# Patient Record
Sex: Female | Born: 1975 | Race: White | Hispanic: No | Marital: Married | Smoking: Never smoker
Health system: Southern US, Community
[De-identification: ages and names within clinical notes are randomized; demographics above are authoritative.]

## PROBLEM LIST (undated history)

## (undated) DIAGNOSIS — D649 Anemia, unspecified: Secondary | ICD-10-CM

## (undated) DIAGNOSIS — O09299 Supervision of pregnancy with other poor reproductive or obstetric history, unspecified trimester: Secondary | ICD-10-CM

## (undated) DIAGNOSIS — E282 Polycystic ovarian syndrome: Secondary | ICD-10-CM

## (undated) DIAGNOSIS — Z87442 Personal history of urinary calculi: Secondary | ICD-10-CM

## (undated) DIAGNOSIS — O09529 Supervision of elderly multigravida, unspecified trimester: Secondary | ICD-10-CM

## (undated) DIAGNOSIS — L680 Hirsutism: Secondary | ICD-10-CM

## (undated) DIAGNOSIS — N209 Urinary calculus, unspecified: Secondary | ICD-10-CM

## (undated) HISTORY — DX: Hirsutism: L68.0

## (undated) HISTORY — DX: Personal history of urinary calculi: Z87.442

## (undated) HISTORY — DX: Supervision of elderly multigravida, unspecified trimester: O09.529

## (undated) HISTORY — DX: Anemia, unspecified: D64.9

## (undated) HISTORY — DX: Supervision of pregnancy with other poor reproductive or obstetric history, unspecified trimester: O09.299

## (undated) HISTORY — DX: Polycystic ovarian syndrome: E28.2

## (undated) HISTORY — PX: OTHER SURGICAL HISTORY: SHX169

## (undated) HISTORY — DX: Urinary calculus, unspecified: N20.9

---

## 1999-07-27 ENCOUNTER — Other Ambulatory Visit: Admission: RE | Admit: 1999-07-27 | Discharge: 1999-07-27 | Payer: Self-pay | Admitting: Obstetrics and Gynecology

## 2000-08-01 ENCOUNTER — Other Ambulatory Visit: Admission: RE | Admit: 2000-08-01 | Discharge: 2000-08-01 | Payer: Self-pay | Admitting: Obstetrics and Gynecology

## 2001-08-15 ENCOUNTER — Other Ambulatory Visit: Admission: RE | Admit: 2001-08-15 | Discharge: 2001-08-15 | Payer: Self-pay | Admitting: Obstetrics and Gynecology

## 2002-09-18 ENCOUNTER — Other Ambulatory Visit: Admission: RE | Admit: 2002-09-18 | Discharge: 2002-09-18 | Payer: Self-pay | Admitting: Obstetrics and Gynecology

## 2003-11-18 ENCOUNTER — Other Ambulatory Visit: Admission: RE | Admit: 2003-11-18 | Discharge: 2003-11-18 | Payer: Self-pay | Admitting: Obstetrics and Gynecology

## 2005-01-05 ENCOUNTER — Other Ambulatory Visit: Admission: RE | Admit: 2005-01-05 | Discharge: 2005-01-05 | Payer: Self-pay | Admitting: Obstetrics and Gynecology

## 2006-02-18 ENCOUNTER — Other Ambulatory Visit: Admission: RE | Admit: 2006-02-18 | Discharge: 2006-02-18 | Payer: Self-pay | Admitting: Obstetrics & Gynecology

## 2007-02-19 ENCOUNTER — Other Ambulatory Visit: Admission: RE | Admit: 2007-02-19 | Discharge: 2007-02-19 | Payer: Self-pay | Admitting: Obstetrics & Gynecology

## 2007-07-01 ENCOUNTER — Ambulatory Visit (HOSPITAL_COMMUNITY): Admission: RE | Admit: 2007-07-01 | Discharge: 2007-07-01 | Payer: Self-pay | Admitting: Gynecology

## 2008-02-13 ENCOUNTER — Ambulatory Visit: Payer: Self-pay | Admitting: Cardiology

## 2008-02-13 LAB — CONVERTED CEMR LAB: Magnesium: 2.3 mg/dL (ref 1.5–2.5)

## 2008-02-25 ENCOUNTER — Ambulatory Visit: Payer: Self-pay

## 2008-02-25 ENCOUNTER — Encounter: Payer: Self-pay | Admitting: Cardiology

## 2008-02-27 ENCOUNTER — Ambulatory Visit: Payer: Self-pay | Admitting: Cardiology

## 2008-02-27 LAB — CONVERTED CEMR LAB
BUN: 5 mg/dL — ABNORMAL LOW (ref 6–23)
Chloride: 105 meq/L (ref 96–112)
GFR calc non Af Amer: 197 mL/min
Potassium: 3.8 meq/L (ref 3.5–5.1)

## 2008-03-17 ENCOUNTER — Inpatient Hospital Stay (HOSPITAL_COMMUNITY): Admission: AD | Admit: 2008-03-17 | Discharge: 2008-03-17 | Payer: Self-pay | Admitting: Obstetrics and Gynecology

## 2008-03-19 ENCOUNTER — Inpatient Hospital Stay (HOSPITAL_COMMUNITY): Admission: AD | Admit: 2008-03-19 | Discharge: 2008-03-19 | Payer: Self-pay | Admitting: Obstetrics and Gynecology

## 2008-03-20 ENCOUNTER — Inpatient Hospital Stay (HOSPITAL_COMMUNITY): Admission: AD | Admit: 2008-03-20 | Discharge: 2008-03-20 | Payer: Self-pay | Admitting: Obstetrics and Gynecology

## 2008-04-20 ENCOUNTER — Inpatient Hospital Stay (HOSPITAL_COMMUNITY): Admission: AD | Admit: 2008-04-20 | Discharge: 2008-04-20 | Payer: Self-pay | Admitting: Obstetrics and Gynecology

## 2008-04-21 ENCOUNTER — Inpatient Hospital Stay (HOSPITAL_COMMUNITY): Admission: AD | Admit: 2008-04-21 | Discharge: 2008-04-21 | Payer: Self-pay | Admitting: Obstetrics and Gynecology

## 2008-05-02 ENCOUNTER — Inpatient Hospital Stay (HOSPITAL_COMMUNITY): Admission: AD | Admit: 2008-05-02 | Discharge: 2008-05-05 | Payer: Self-pay | Admitting: Obstetrics and Gynecology

## 2008-05-06 ENCOUNTER — Encounter: Admission: RE | Admit: 2008-05-06 | Discharge: 2008-06-05 | Payer: Self-pay | Admitting: Obstetrics and Gynecology

## 2008-06-06 ENCOUNTER — Encounter: Admission: RE | Admit: 2008-06-06 | Discharge: 2008-06-23 | Payer: Self-pay | Admitting: Obstetrics and Gynecology

## 2010-04-09 NOTE — L&D Delivery Note (Signed)
Delivery Note At 5:20 AM a viable and healthy female was delivered via Vaginal, Spontaneous Delivery (Presentation:OA ).  APGAR: 9, 9; weight 8lbs, 2oz .   Placenta status: Intact, Spontaneous.  Cord: 3 vessels with the following complications: Velamentous.  Cord pH: na Thick meconium noted. Peds in attendance.  Anesthesia: Epidural  Episiotomy: none Lacerations: 2nd degree with no extension Suture Repair: 2.0 vicryl rapide Est. Blood Loss (mL): 300  Mom to postpartum.  Baby to nursery-stable.  Alonzo Owczarzak J 02/11/2011, 5:40 AM

## 2010-07-12 LAB — ANTIBODY SCREEN: Antibody Screen: NEGATIVE

## 2010-07-12 LAB — ABO/RH: RH Type: POSITIVE

## 2010-07-12 LAB — RPR: RPR: NONREACTIVE

## 2010-07-24 LAB — CBC
HCT: 33.6 % — ABNORMAL LOW (ref 36.0–46.0)
HCT: 41.8 % (ref 36.0–46.0)
Hemoglobin: 11.4 g/dL — ABNORMAL LOW (ref 12.0–15.0)
Hemoglobin: 14.1 g/dL (ref 12.0–15.0)
MCHC: 34 g/dL (ref 30.0–36.0)
MCV: 95.8 fL (ref 78.0–100.0)
Platelets: 169 10*3/uL (ref 150–400)
Platelets: 181 K/uL (ref 150–400)
RBC: 3.51 MIL/uL — ABNORMAL LOW (ref 3.87–5.11)
RBC: 4.32 MIL/uL (ref 3.87–5.11)
RDW: 12.8 % (ref 11.5–15.5)
RDW: 12.9 % (ref 11.5–15.5)
RDW: 13.1 % (ref 11.5–15.5)
WBC: 11.7 K/uL — ABNORMAL HIGH (ref 4.0–10.5)
WBC: 13.4 10*3/uL — ABNORMAL HIGH (ref 4.0–10.5)

## 2010-07-24 LAB — DIFFERENTIAL
Basophils Absolute: 0 10*3/uL (ref 0.0–0.1)
Basophils Absolute: 0 K/uL (ref 0.0–0.1)
Basophils Relative: 0 % (ref 0–1)
Basophils Relative: 0 % (ref 0–1)
Eosinophils Absolute: 0 10*3/uL (ref 0.0–0.7)
Eosinophils Absolute: 0 K/uL (ref 0.0–0.7)
Eosinophils Relative: 0 % (ref 0–5)
Eosinophils Relative: 0 % (ref 0–5)
Lymphocytes Relative: 4 % — ABNORMAL LOW (ref 12–46)
Lymphs Abs: 0.6 K/uL — ABNORMAL LOW (ref 0.7–4.0)
Monocytes Absolute: 0.8 K/uL (ref 0.1–1.0)
Monocytes Absolute: 0.9 10*3/uL (ref 0.1–1.0)
Monocytes Relative: 5 % (ref 3–12)
Neutro Abs: 13.8 K/uL — ABNORMAL HIGH (ref 1.7–7.7)
Neutrophils Relative %: 91 % — ABNORMAL HIGH (ref 43–77)

## 2010-08-22 NOTE — Discharge Summary (Signed)
NAMESHINE, SCROGHAM           ACCOUNT NO.:  [id number]   MEDICAL RECORD NO.:  [id number]          PATIENT TYPE:  INP   LOCATION:  9310                          FACILITY:  [facility]   PHYSICIAN:  Sherron Monday, MD        DATE OF BIRTH:  1976-02-28   DATE OF ADMISSION:  05/02/2008  DATE OF DISCHARGE:  05/05/2008                               DISCHARGE SUMMARY   ADMITTING DIAGNOSIS:  Intrauterine pregnancy at 32 weeks, preterm labor.   DISCHARGE DIAGNOSES:  Intrauterine pregnancy at 32 weeks, preterm labor,  delivered via spontaneous vaginal delivery.   PROCEDURE:  Spontaneous vaginal delivery.   HISTORY OF PRESENT ILLNESS:  A 35 year old G1, P0 at 32 plus weeks on  bedrest at home with nausea and vomiting overnight as well as diarrhea.  She states that she has had increasing frequency and intensity of  contractions until presentation.  They are now decreased.  She has had  good fetal movement and no loss of fluid, no vaginal bleeding.  She has  in the past had 2 courses of betamethasone.  This is an IVF pregnancy.  She has had heart palpitations which was seen by Dr. Antoine Poche.  She also  has a history of having a positive fetal fibronectin and prior  presentation being approximately 3 cm dilated.  Fetal arrhythmia was  noted on ultrasound in the office and we are in the process of working  it up.  There are occasional fetal heart tones irregularity, now on  continuous monitoring.   PAST MEDICAL HISTORY:  Not significant.   PAST SURGICAL HISTORY:  Not significant.   PAST OB/GYN HISTORY:  G1 is the present pregnancy by IVF with  hyperstimulation.  No abnormal Pap smears or sexually transmitted  diseases.   MEDICATIONS:  Terbutaline q.6, prenatal vitamins, and iron.   ALLERGIES:  No known drug allergies.   SOCIAL HISTORY:  Denies alcohol, tobacco, or drug use.  She is married.   FAMILY HISTORY:  Significant for breast cancer in the mother.  Paternal  grandfather with prostate and  stomach cancer.  Heart disease in paternal  grandfather.  Hypertension in father.  Maternal grandmother with  thrombophlebitis.  Aunt with thyroid disease.   Physical Exam on admission is benign, with uterine contractions  decreased by terbutaline and IV fluid.  On cervical exam, she is 3 cm  dilated, so she was admitted for closer monitoring.  In the course of  her admission, her contractions increased and there was cervical change.  At this point, she was transferred to L&D and given an epidural  She  also received Ampicillin for gbbs prophylaxis.  Her labor progressed and  she was ruptured at 9cm.  She progressed to C/C/+2 pushed to deliver  viable infant at 6:37am on 05/03/08 apgars 6 at 1 min, 7 at 5 min, weigth  4# 9 oz.  NICU had been able to meet with her prioir to delivery and was  present at delivery.  Her post partum course was relatively  uncomplicated, she remained afebrile, with pain well controlled.  She  was discharged to home on postpartum  day # 2 with routine discharge  instructions and numbers to call with any questions or problems.  She  voices understanding to this and will follow up in 6 weeks.  She plans  to breast feed, is o positive, rubella immune     Dictation ended at this point.      Sherron Monday, MD  Electronically Signed     JB/MEDQ  D:  05/05/2008  T:  05/06/2008  Job:  [id number]

## 2010-08-25 NOTE — Assessment & Plan Note (Signed)
[facility]                            CARDIOLOGY OFFICE NOTE   Paige Ramirez, Paige Ramirez                  MRN:          [id number]  DATE:02/12/2006                            DOB:          [birth year]    PRIMARY CARE PHYSICIAN:  Sherron Monday, MD   REASON FOR CONSULTATION:  Evaluate the patient with some palpitations.   HISTORY OF PRESENT ILLNESS:  The patient is a pleasant 35 year old white  female who is 69 weeks' pregnant with her first child following in vitro  fertilization.  She said she has had palpitations since the 5th grade.  She was treated this conservatively, trying to avoid caffeine.  However,  since she has been pregnant they have been more frequent.  They have  been happening perhaps one time a week.  She describes episodes perhaps  skipped beat, but then she will get episodes where her heart will speed  up.  It may last for several minutes.  She will get a tightness or  heaviness.  It stops abruptly.  She said one time recently in her first  trimester it was fairly significant she thought she might need to go to  the hospital, but it stopped.  She has not had any syncope with this,  though she had an episode like this 5 years ago.  She said that actually  in the second trimester she has not had any of these sustained episodes.  She has had a few isolated skipped beats.  She is unable to bring these  on.  She otherwise feels well.  She has had no chest discomfort, neck,  or arm discomfort.  She has not have  any shortness of breath.  She  denies any PND or orthopnea.  She has not had any evaluation or  management of this in the past.  She has had no specific therapy for  this.   PAST MEDICAL HISTORY:  1. Intrauterine pregnancy, ongoing 21 weeks.  2. She has no history of hypertension.  3. Diabetes.  4. Hyperlipidemia.   PAST SURGICAL HISTORY:  None.   ALLERGIES:  None.   MEDICATIONS:  Prenatal vitamin, iron supplements.   SOCIAL HISTORY:  The patient works in the [employer] of [place].  She is married.  She stated she is expecting her first child, greet to a  girl!.  She does not smoke cigarettes and never really has.  She does  not drink alcohol.  She drinks one caffeinated beverage a week now.   FAMILY HISTORY:  Noncontributory for early coronary artery disease,  sudden cardiac death, syncope, heart failure.   REVIEW OF SYSTEMS:  As stated in the HPI and negative for all other  systems.   PHYSICAL EXAMINATION:  GENERAL:  The patient is well and in no distress.  VITAL SIGNS:  Blood pressure 95/65, heart rate 76 and regular, weight  148 pounds, body mass index 24.  HEENT:  Eyes are unremarkable, pupils equal, round, and reactive to  light, fundi not visualized, oral mucosa unremarkable.  NECK:  No jugular distention at 45 degrees, carotid upstroke brisk and  symmetric, no  bruits, no thyromegaly.  LYMPHATICS:  No cervical, axillary, or inguinal adenopathy.  LUNGS:  Clear to auscultation bilaterally.  BACK:  No costovertebral angle tenderness.  CHEST:  Unremarkable.  HEART:  PMI not displaced or sustained, S1 and S2 within normal limits,  no S3, no S4, no clicks, no rubs, no murmurs.  ABDOMEN:  Gravid,  positive bowel sounds, normal in frequency and pitch, no bruits, no  rebound, no guarding, no midline pulsatile mass, no hepatomegaly, no  splenomegaly.  SKIN:  No rashes, no nodules.  EXTREMITIES:  2+ pulses throughout, no edema, no cyanosis, no clubbing.  NEURO:  Oriented to person, place, and time, cranial nerves II-XII  grossly intact, motor grossly intact.   EKG sinus arrhythmia, rate 68, axis within normal limits, intervals  within normal limits, no acute ST-T wave changes.   ASSESSMENT AND PLAN:  1. Palpitations.  The patient describes ectopic beats.  She also,      however, describes sustained tachyarrhythmia that could be a      reentrant dysrhythmia.  She has not had any of these  sustained      episodes though recently.  She is having the isolated palpitations      less frequently than she was at a few weeks ago.  She has not had      any recent presyncope or syncope.  At this point, I am going to      check a TSH, magnesium, and potassium.  I am going to get an      echocardiogram to make sure she has a structurally normal heart.      However, if her palpitations remain infrequent and minimally      symptomatic as they currently are.  I would not pursue any further      workup.  If, however, she has any more of the sustained episodes or      increasing ectopy, I would want to put her on an event monitor.      She and I have discussed this and she will call me.  For now, no      therapy is warranted.  We did discuss vagal maneuvers should she      have any sustained episodes.  2. Followup.  I will see the patient back based on the results of the      above or any future symptoms.     Rollene Rotunda, MD, [facility]  Electronically Signed    JH/MedQ  DD: 02/13/2008  DT: 02/14/2008  Job #: [id number]   cc:   Sherron Monday, MD

## 2010-11-20 IMAGING — US US OB COMP +14 WK
1 series · 14 of 28 positions shown · non-contrast
Comparison: none

OBSTETRICAL ULTRASOUND:
 This ultrasound exam was performed in the [HOSPITAL] Ultrasound Department.  The OB US report was generated in the AS system, and faxed to the ordering physician.  This report is also available in [REDACTED] PACS.

[Series 1: us ob comp +14 wk · 14 of 29 slices shown]
[im 2/29]
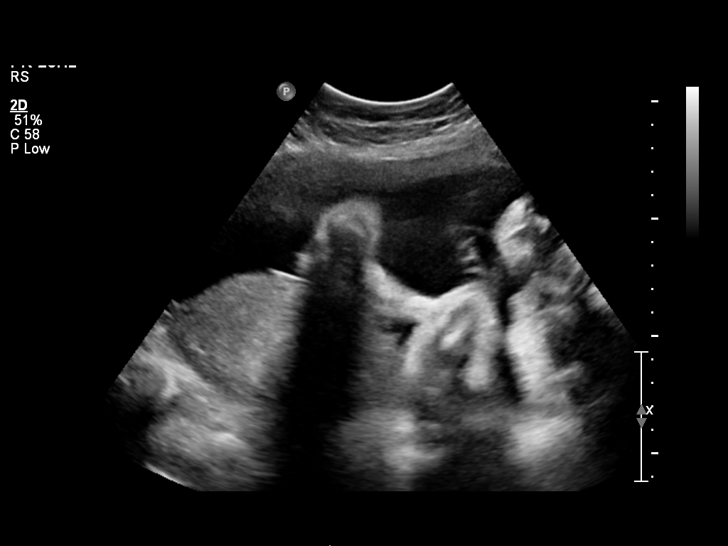
[im 4/29]
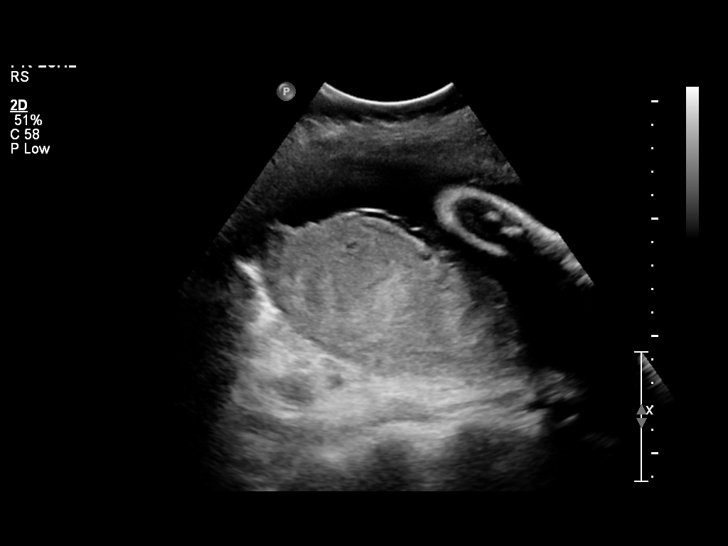
[im 6/29]
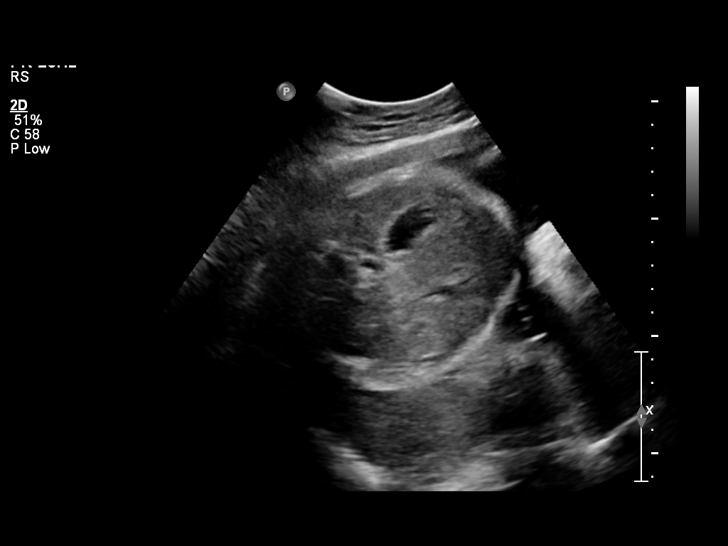
[im 8/29]
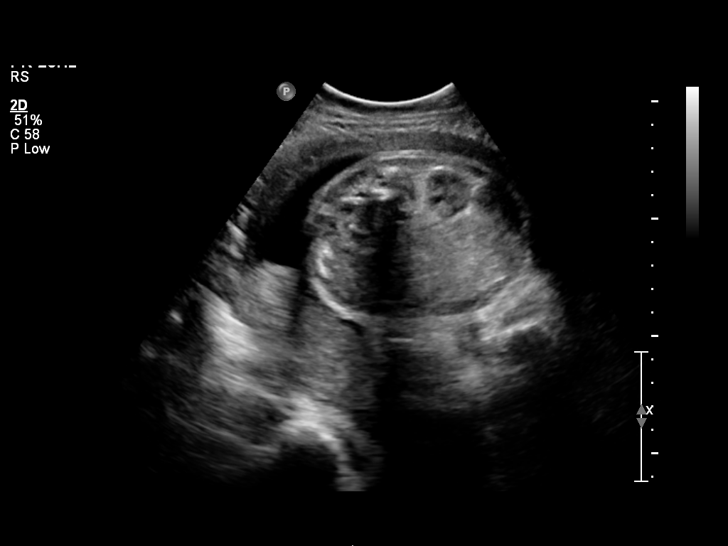
[im 10/29]
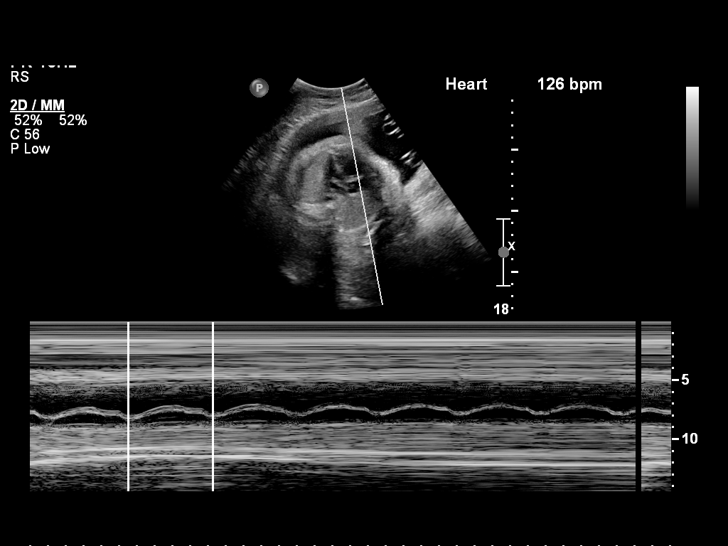
[im 12/29]
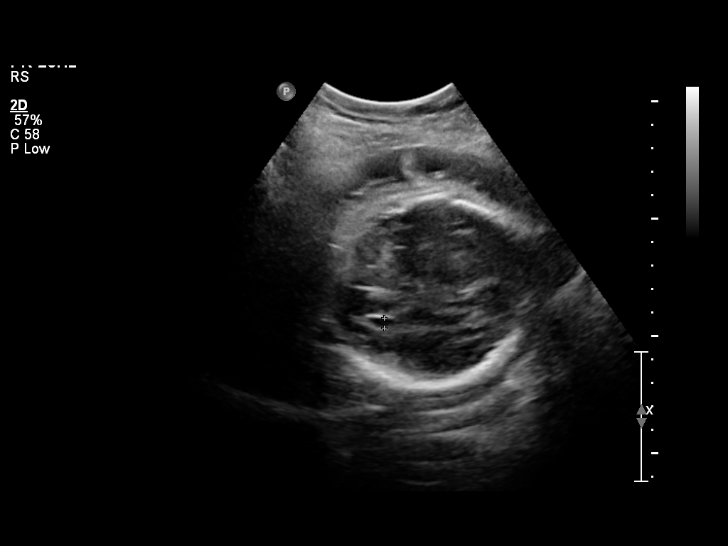
[im 14/29]
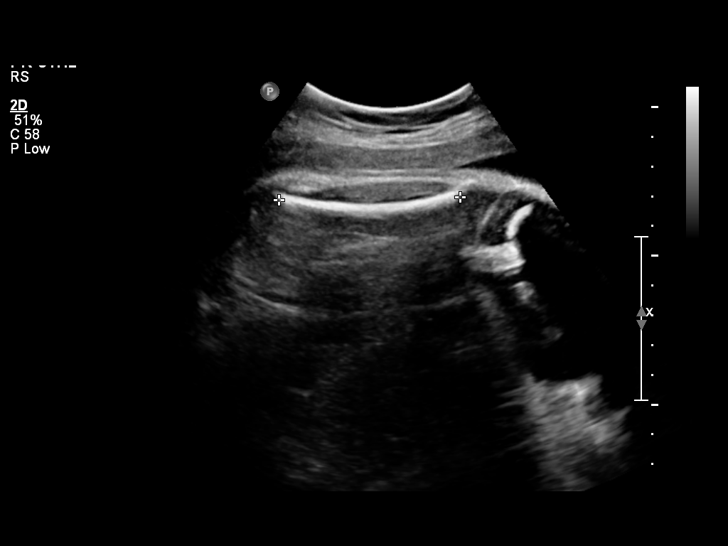
[im 16/29]
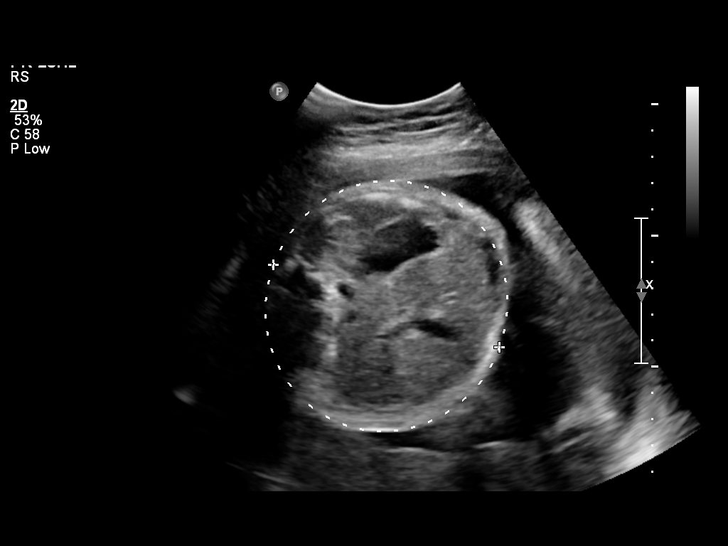
[im 18/29]
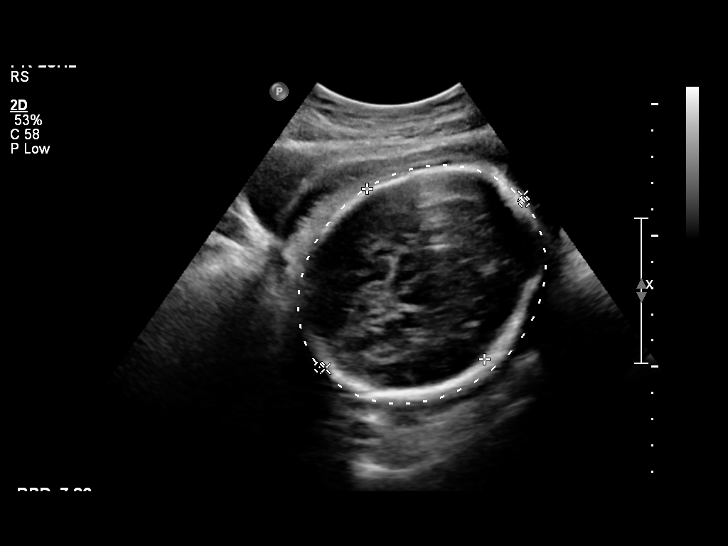
[im 20/29]
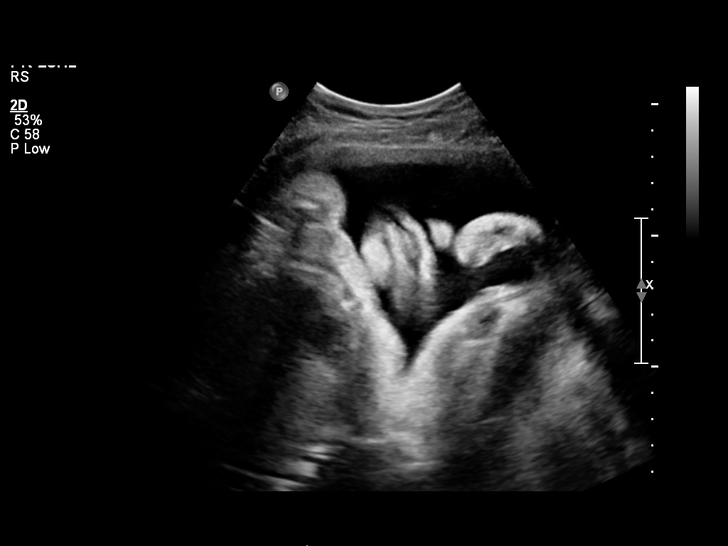
[im 22/29]
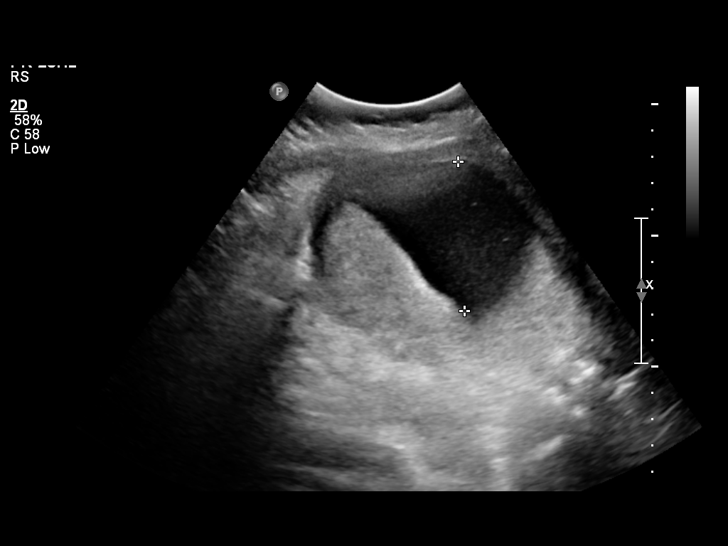
[im 24/29]
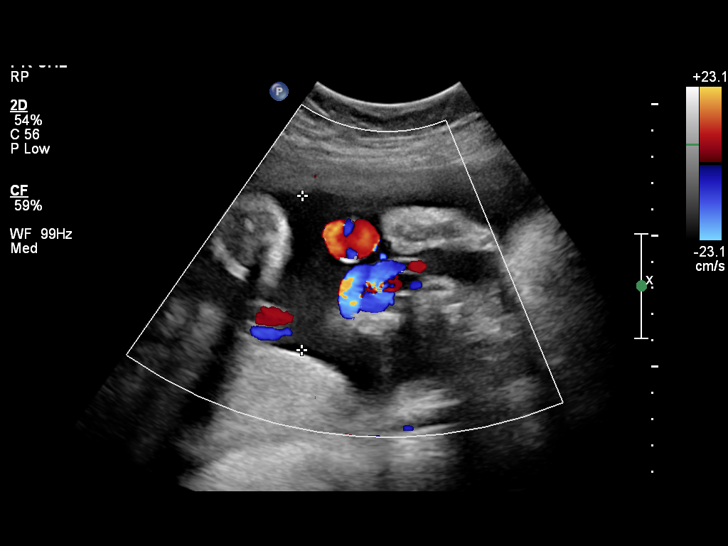
[im 26/29]
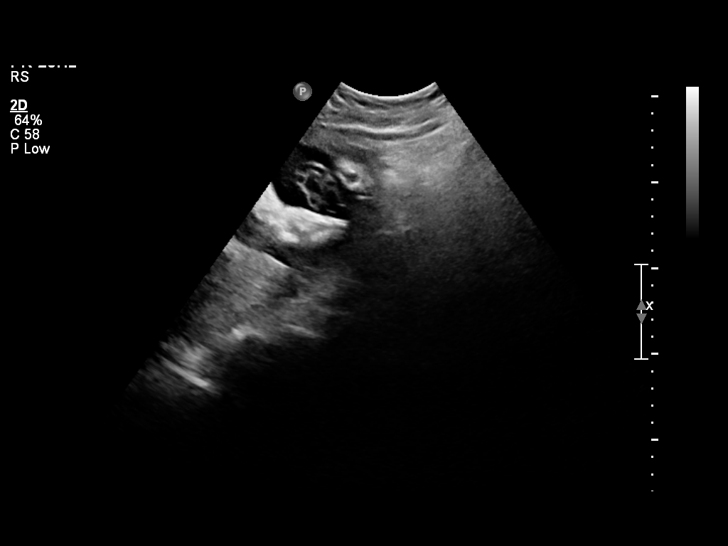
[im 29/29]
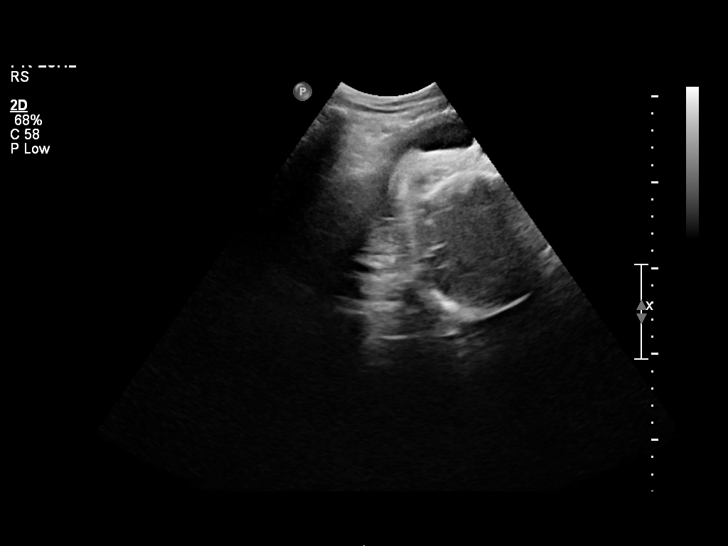

[14 of 28 positions shown; findings below may reference images not displayed]

IMPRESSION: See AS Obstetric US report.

## 2010-12-20 ENCOUNTER — Other Ambulatory Visit (HOSPITAL_COMMUNITY): Payer: Self-pay | Admitting: Obstetrics and Gynecology

## 2010-12-20 DIAGNOSIS — R1032 Left lower quadrant pain: Secondary | ICD-10-CM

## 2010-12-20 DIAGNOSIS — M549 Dorsalgia, unspecified: Secondary | ICD-10-CM

## 2010-12-20 DIAGNOSIS — N209 Urinary calculus, unspecified: Secondary | ICD-10-CM

## 2010-12-25 ENCOUNTER — Ambulatory Visit (HOSPITAL_COMMUNITY)
Admission: RE | Admit: 2010-12-25 | Discharge: 2010-12-25 | Disposition: A | Discharge status: Home / Self Care | Payer: 59 | Source: Ambulatory Visit | Attending: Obstetrics and Gynecology | Admitting: Obstetrics and Gynecology

## 2010-12-25 DIAGNOSIS — N1330 Unspecified hydronephrosis: Secondary | ICD-10-CM | POA: Insufficient documentation

## 2010-12-25 DIAGNOSIS — R1032 Left lower quadrant pain: Secondary | ICD-10-CM | POA: Insufficient documentation

## 2010-12-25 DIAGNOSIS — M549 Dorsalgia, unspecified: Secondary | ICD-10-CM

## 2010-12-25 DIAGNOSIS — N209 Urinary calculus, unspecified: Secondary | ICD-10-CM

## 2011-02-07 ENCOUNTER — Encounter (HOSPITAL_COMMUNITY): Payer: Self-pay | Admitting: *Deleted

## 2011-02-07 ENCOUNTER — Telehealth (HOSPITAL_COMMUNITY): Payer: Self-pay | Admitting: *Deleted

## 2011-02-07 NOTE — Telephone Encounter (Signed)
Preadmission screen  

## 2011-02-10 ENCOUNTER — Inpatient Hospital Stay (HOSPITAL_COMMUNITY)
Admission: AD | Admit: 2011-02-10 | Discharge: 2011-02-13 | DRG: 775 | Disposition: A | Discharge status: Home / Self Care | Payer: 59 | Source: Ambulatory Visit | Attending: Obstetrics & Gynecology | Admitting: Obstetrics & Gynecology

## 2011-02-10 ENCOUNTER — Encounter (HOSPITAL_COMMUNITY): Payer: Self-pay | Admitting: *Deleted

## 2011-02-10 ENCOUNTER — Inpatient Hospital Stay (HOSPITAL_COMMUNITY): Payer: 59 | Admitting: Anesthesiology

## 2011-02-10 ENCOUNTER — Encounter (HOSPITAL_COMMUNITY): Payer: Self-pay | Admitting: Anesthesiology

## 2011-02-10 DIAGNOSIS — O09529 Supervision of elderly multigravida, unspecified trimester: Secondary | ICD-10-CM | POA: Diagnosis present

## 2011-02-10 LAB — CBC
HCT: 39.9 % (ref 36.0–46.0)
Hemoglobin: 13.5 g/dL (ref 12.0–15.0)
MCH: 31.8 pg (ref 26.0–34.0)
MCHC: 33.8 g/dL (ref 30.0–36.0)
RBC: 4.25 MIL/uL (ref 3.87–5.11)

## 2011-02-10 MED ORDER — IBUPROFEN 600 MG PO TABS: 600.0000 mg | ORAL_TABLET | Freq: Four times a day (QID) | ORAL | Status: DC | PRN

## 2011-02-10 MED ORDER — LIDOCAINE HCL (PF) 1 % IJ SOLN: 30.0000 mL | INTRAMUSCULAR | Status: DC | PRN

## 2011-02-10 MED ORDER — CITRIC ACID-SODIUM CITRATE 334-500 MG/5ML PO SOLN: 30.0000 mL | ORAL | Status: DC | PRN

## 2011-02-10 MED ORDER — LACTATED RINGERS IV SOLN
INTRAVENOUS | Status: DC
  Administered 2011-02-10: 22:00:00 via INTRAVENOUS

## 2011-02-10 MED ORDER — SODIUM BICARBONATE 8.4 % IV SOLN
INTRAVENOUS | Status: DC | PRN
  Administered 2011-02-10: 4 mL via EPIDURAL

## 2011-02-10 MED ORDER — LACTATED RINGERS IV SOLN
500.0000 mL | INTRAVENOUS | Status: DC | PRN
  Administered 2011-02-11: 500 mL via INTRAVENOUS

## 2011-02-10 MED ORDER — OXYTOCIN BOLUS FROM INFUSION
500.0000 mL | Freq: Once | INTRAVENOUS | Status: DC
  Filled 2011-02-10: qty 1000
  Filled 2011-02-10: qty 500

## 2011-02-10 MED ORDER — ONDANSETRON HCL 4 MG/2ML IJ SOLN: 4.0000 mg | Freq: Four times a day (QID) | INTRAMUSCULAR | Status: DC | PRN

## 2011-02-10 MED ORDER — OXYCODONE-ACETAMINOPHEN 5-325 MG PO TABS: 2.0000 | ORAL_TABLET | ORAL | Status: DC | PRN

## 2011-02-10 MED ORDER — ACETAMINOPHEN 325 MG PO TABS: 650.0000 mg | ORAL_TABLET | ORAL | Status: DC | PRN

## 2011-02-10 MED ORDER — OXYTOCIN 20 UNITS IN LACTATED RINGERS INFUSION - SIMPLE
125.0000 mL/h | Freq: Once | INTRAVENOUS | Status: AC
  Administered 2011-02-11: 125 mL/h via INTRAVENOUS

## 2011-02-10 MED ORDER — LIDOCAINE HCL 1.5 % IJ SOLN
INTRAMUSCULAR | Status: DC | PRN
  Administered 2011-02-10: 5 mL via EPIDURAL

## 2011-02-10 MED ORDER — FENTANYL 2.5 MCG/ML BUPIVACAINE 1/10 % EPIDURAL INFUSION (WH - ANES)
INTRAMUSCULAR | Status: DC | PRN
  Administered 2011-02-10: 14 mL/h via EPIDURAL
  Administered 2011-02-11: 01:00:00

## 2011-02-10 MED ORDER — FLEET ENEMA 7-19 GM/118ML RE ENEM: 1.0000 | ENEMA | RECTAL | Status: DC | PRN

## 2011-02-10 NOTE — Progress Notes (Signed)
Applied and assessing

## 2011-02-10 NOTE — Anesthesia Procedure Notes (Signed)

## 2011-02-10 NOTE — Progress Notes (Signed)
Paige Ramirez is a 35 y.o. G2P0101 at [redacted]w[redacted]d by LMP admitted for active labor  Subjective:   Objective: BP 138/81  Pulse 74  Temp(Src) 98.2 F (36.8 C) (Oral)  Resp 20  Ht 5' 5.5" (1.664 m)  Wt 78.642 kg (173 lb 6 oz)  BMI 28.41 kg/m2      FHT:  Reactive with occ early decels UC:   regular, every 2 minutes SVE:   Dilation: 7.5 Effacement (%): 100 Station: -1 Exam by:: Raelyn Mora, RN  Labs: Lab Results  Component Value Date   WBC 13.4* 05/04/2008   HGB 11.6* 05/04/2008   HCT 33.4* 05/04/2008   MCV 96.2 05/04/2008   PLT 169 05/04/2008    Assessment / Plan: Spontaneous labor, progressing normally  Labor: Progressing normally Preeclampsia:  na Fetal Wellbeing:  Category I Pain Control:  Epidural I/D:  n/a Anticipated MOD:  NSVD H/P 161096  Azlan Hanway J 02/10/2011, 10:14 PM

## 2011-02-10 NOTE — Progress Notes (Signed)
Pt states, " I've had contractions off and on since 6 am, but for t he past three hours they are closer and stronger."

## 2011-02-10 NOTE — Anesthesia Preprocedure Evaluation (Signed)

## 2011-02-11 ENCOUNTER — Encounter (HOSPITAL_COMMUNITY): Payer: Self-pay | Admitting: *Deleted

## 2011-02-11 LAB — RPR: RPR Ser Ql: NONREACTIVE

## 2011-02-11 MED ORDER — IBUPROFEN 600 MG PO TABS
600.0000 mg | ORAL_TABLET | Freq: Four times a day (QID) | ORAL | Status: DC
  Administered 2011-02-11 – 2011-02-13 (×8): 600 mg via ORAL
  Filled 2011-02-11 (×8): qty 1

## 2011-02-11 MED ORDER — ONDANSETRON HCL 4 MG PO TABS: 4.0000 mg | ORAL_TABLET | ORAL | Status: DC | PRN

## 2011-02-11 MED ORDER — METHYLERGONOVINE MALEATE 0.2 MG/ML IJ SOLN: 0.2000 mg | INTRAMUSCULAR | Status: DC | PRN

## 2011-02-11 MED ORDER — FENTANYL 2.5 MCG/ML BUPIVACAINE 1/10 % EPIDURAL INFUSION (WH - ANES)
INTRAMUSCULAR | Status: AC
  Filled 2011-02-11: qty 60

## 2011-02-11 MED ORDER — LANOLIN HYDROUS EX OINT: TOPICAL_OINTMENT | CUTANEOUS | Status: DC | PRN

## 2011-02-11 MED ORDER — DIBUCAINE 1 % RE OINT
1.0000 "application " | TOPICAL_OINTMENT | RECTAL | Status: DC | PRN
  Administered 2011-02-11: 1 via RECTAL
  Filled 2011-02-11: qty 28

## 2011-02-11 MED ORDER — METHYLERGONOVINE MALEATE 0.2 MG PO TABS: 0.2000 mg | ORAL_TABLET | ORAL | Status: DC | PRN

## 2011-02-11 MED ORDER — PRENATAL PLUS 27-1 MG PO TABS
1.0000 | ORAL_TABLET | Freq: Every day | ORAL | Status: DC
  Administered 2011-02-11 – 2011-02-13 (×3): 1 via ORAL
  Filled 2011-02-11 (×3): qty 1

## 2011-02-11 MED ORDER — ONDANSETRON HCL 4 MG/2ML IJ SOLN: 4.0000 mg | INTRAMUSCULAR | Status: DC | PRN

## 2011-02-11 MED ORDER — LACTATED RINGERS IV SOLN: 500.0000 mL | Freq: Once | INTRAVENOUS | Status: DC

## 2011-02-11 MED ORDER — SIMETHICONE 80 MG PO CHEW: 80.0000 mg | CHEWABLE_TABLET | ORAL | Status: DC | PRN

## 2011-02-11 MED ORDER — EPHEDRINE 5 MG/ML INJ: 10.0000 mg | INTRAVENOUS | Status: DC | PRN

## 2011-02-11 MED ORDER — SENNOSIDES-DOCUSATE SODIUM 8.6-50 MG PO TABS
2.0000 | ORAL_TABLET | Freq: Every day | ORAL | Status: DC
  Administered 2011-02-11 – 2011-02-12 (×2): 2 via ORAL

## 2011-02-11 MED ORDER — TETANUS-DIPHTH-ACELL PERTUSSIS 5-2.5-18.5 LF-MCG/0.5 IM SUSP
0.5000 mL | Freq: Once | INTRAMUSCULAR | Status: AC
  Administered 2011-02-12: 0.5 mL via INTRAMUSCULAR

## 2011-02-11 MED ORDER — ZOLPIDEM TARTRATE 5 MG PO TABS: 5.0000 mg | ORAL_TABLET | Freq: Every evening | ORAL | Status: DC | PRN

## 2011-02-11 MED ORDER — PHENYLEPHRINE 40 MCG/ML (10ML) SYRINGE FOR IV PUSH (FOR BLOOD PRESSURE SUPPORT): 80.0000 ug | PREFILLED_SYRINGE | INTRAVENOUS | Status: DC | PRN

## 2011-02-11 MED ORDER — BENZOCAINE-MENTHOL 20-0.5 % EX AERO
1.0000 "application " | INHALATION_SPRAY | CUTANEOUS | Status: DC | PRN
  Administered 2011-02-11: 1 via TOPICAL

## 2011-02-11 MED ORDER — FENTANYL 2.5 MCG/ML BUPIVACAINE 1/10 % EPIDURAL INFUSION (WH - ANES)
14.0000 mL/h | INTRAMUSCULAR | Status: DC
  Administered 2011-02-11: 14 mL/h via EPIDURAL

## 2011-02-11 MED ORDER — DIPHENHYDRAMINE HCL 25 MG PO CAPS: 25.0000 mg | ORAL_CAPSULE | Freq: Four times a day (QID) | ORAL | Status: DC | PRN

## 2011-02-11 MED ORDER — WITCH HAZEL-GLYCERIN EX PADS
1.0000 "application " | MEDICATED_PAD | CUTANEOUS | Status: DC | PRN
  Administered 2011-02-11: 1 via TOPICAL

## 2011-02-11 MED ORDER — BENZOCAINE-MENTHOL 20-0.5 % EX AERO
INHALATION_SPRAY | CUTANEOUS | Status: AC
  Administered 2011-02-11: 1 via TOPICAL
  Filled 2011-02-11: qty 56

## 2011-02-11 MED ORDER — OXYCODONE-ACETAMINOPHEN 5-325 MG PO TABS: 1.0000 | ORAL_TABLET | ORAL | Status: DC | PRN

## 2011-02-11 MED ORDER — DIPHENHYDRAMINE HCL 50 MG/ML IJ SOLN: 12.5000 mg | INTRAMUSCULAR | Status: DC | PRN

## 2011-02-11 NOTE — H&P (Signed)
Paige Ramirez, Paige Ramirez           ACCOUNT NO.:  [id number]  MEDICAL RECORD NO.:  [id number]  LOCATION:  9121                          FACILITY:  [facility]  PHYSICIAN:  Lenoard Aden, M.D.DATE OF BIRTH:  11-23-1975  DATE OF ADMISSION:  02/10/2011 DATE OF DISCHARGE:                             HISTORY & PHYSICAL   CHIEF COMPLAINT:  Labor.  HISTORY OF PRESENT ILLNESS:  She is a 35 year old white female, G2, P1, at 63 and 5/7th weeks' gestation with increased frequency of contractions today.  PAST MEDICAL HISTORY:  Remarkable for no known drug allergies.  She has a history of PCOS and history of preterm birth.  FAMILY HISTORY:  She has a family history of colon cancer, hypertension, heart disease, breast cancer, diabetes, and MI.  OB/GYN HISTORY:  She has a pregnancy is remarkable for one 32 weeks' vaginal delivery.  Her prenatal course this pregnancy complicated by urolithiasis treated expectantly with medication, history of preterm cervical change on weekly 17-OHP until 36 weeks, and history of transient fetal arrhythmia with a normal fetal echo.  Her GBS was negative.  PHYSICAL EXAMINATION:  GENERAL:  The patient is an uncomfortable- appearing white female, in no acute distress. HEENT:  Normal. NECK:  Supple.  Full range of motion. LUNGS:  Clear. HEART:  Regular rate and rhythm. ABDOMEN:  Soft, gravid, and nontender.  Estimated fetal weight 7-1/2 to 8 pounds.  Cervix is per RN 7-8 cm, 100% vertex, -1. EXTREMITIES:  No cords. NEUROLOGIC:  Nonfocal. SKIN:  Intact.  IMPRESSION: 1. Term intrauterine pregnancy in active labor. 2. Group B strep negative. 3. History of preterm birth. 4. History of urolithiasis.  PLAN:  Administer epidural, anticipate attempts at vaginal delivery.     Lenoard Aden, M.D.     RJT/MEDQ  D:  02/10/2011  T:  02/11/2011  Job:  [id number]  cc:   Ma Hillock

## 2011-02-11 NOTE — Progress Notes (Signed)
Initially Fluid was noted to be clear at AROM. Now with pushing Particulate meconium leaking.

## 2011-02-11 NOTE — Anesthesia Postprocedure Evaluation (Signed)
Anesthesia Post Note  Patient: Paige Ramirez  Procedure(s) Performed: * No procedures listed *  Anesthesia type: Epidural  Patient location: Mother/Baby  Post pain: Pain level controlled  Post assessment: Post-op Vital signs reviewed  Last Vitals:  Filed Vitals:   02/11/11 0750  BP: 123/80  Pulse: 77  Temp: 36.7 C  Resp: 19    Post vital signs: Reviewed  Level of consciousness: awake  Complications: No apparent anesthesia complications

## 2011-02-11 NOTE — Progress Notes (Signed)
Dr. Billy Coast notified of urine output decreased and blood tinged, Particulate Meconium noted.

## 2011-02-12 LAB — CBC
HCT: 34.3 % — ABNORMAL LOW (ref 36.0–46.0)
Hemoglobin: 11.3 g/dL — ABNORMAL LOW (ref 12.0–15.0)
MCHC: 32.9 g/dL (ref 30.0–36.0)
RDW: 13.6 % (ref 11.5–15.5)
WBC: 12 10*3/uL — ABNORMAL HIGH (ref 4.0–10.5)

## 2011-02-12 MED ORDER — HYDROCORTISONE ACE-PRAMOXINE 1-1 % RE FOAM
1.0000 | Freq: Two times a day (BID) | RECTAL | Status: DC
  Administered 2011-02-12 – 2011-02-13 (×3): 1 via RECTAL
  Filled 2011-02-12 (×2): qty 10

## 2011-02-12 NOTE — Progress Notes (Signed)
  PPD 1 SVD  S:  Reports feeling well - only discomfort is hemorrhoids            Considering early discharge            Tolerating po/ No nausea or vomiting            Bleeding is moderate            Pain controlled prescription NSAID's including motrin and narcotic analgesics including percocet            Up ad lib / ambulatory            Newborn breast feeding  / Circumcision today after discussion with Dr Billy Coast this am   O:  A & O x 3 NAD             VS: Blood pressure 116/76, pulse 69, temperature 97.8 F (36.6 C), temperature source Oral, resp. rate 18, height 5\' 5"  (1.651 m), weight 78.472 kg (173 lb), SpO2 100.00%, unknown if currently breastfeeding.  LABS: Lab Results  Component Value Date   WBC 12.0* 02/12/2011   HGB 11.3* 02/12/2011   HCT 34.3* 02/12/2011   MCV 95.8 02/12/2011   PLT 122* 02/12/2011     Lungs: Clear and unlabored  Heart: regular rate and rhythm / no mumurs  Abdomen: soft, non-tender, non-distended              Fundus: firm, non-tender, U-1  Perineum: mild edema - ice pack in place / hemorrhoids enlarged and inflamed - pink  Lochia: moderate  Extremities: no edema, no calf pain or tenderness    A: PPD # 1   Doing well - stable status         P:  Routine post partum orders  Newborn circ after discussion with parents - Dr Billy Coast in with parents / reviewed latest data               on circumcisions and rationale for recommendation  / answered questions with parents.             Considering discharge this PM - revisit this afternoon for possible dc  Paige Ramirez,Paige Ramirez 02/12/2011, 9:03 AM

## 2011-02-13 MED ORDER — IBUPROFEN 600 MG PO TABS: 600.0000 mg | ORAL_TABLET | Freq: Four times a day (QID) | ORAL | Status: AC

## 2011-02-13 NOTE — Discharge Summary (Signed)
Obstetric Discharge Summary Reason for Admission: onset of labor and at 39 wks Prenatal Procedures: ultrasound Intrapartum Procedures: none Postpartum Procedures: none Complications-Operative and Postpartum: 2nd degree lac Hemoglobin  Date Value Range Status  02/12/2011 11.3* 12.0-15.0 (g/dL) Final     HCT  Date Value Range Status  02/12/2011 34.3* 36.0-46.0 (%) Final    Discharge Diagnoses: Term Pregnancy-delivered  Discharge Information: Date: 02/13/2011 Activity: pelvic rest Diet: routine Medications: Ibuprofen Condition: stable Instructions: refer to practice specific booklet Discharge to: home   Newborn Data: Live born female on 01/11/11 Birth Weight: 8 lb 2.9 oz (3711 g) APGAR: 9, 9  Home with mother.  Mackensie Pilson K 02/13/2011, 8:57 AM

## 2011-02-13 NOTE — Progress Notes (Signed)
  PPD # 2  Subjective: Pt reports feeling well and eager for d/c home/ Pain controlled with motrin Tolerating po/ Voiding without problems/ No n/v Bleeding is moderate/ Newborn info:  Information for the patient's newborn:  [place], Simm [[id number]]  female  / circ complete/ Feeding: breast    Objective:  VS: Blood pressure 122/74, pulse 63, temperature 97.6 F (36.4 C), temperature source Oral, resp. rate 18, height 5\' 5"  (1.651 m), weight 78.472 kg (173 lb), SpO2 100.00%, unknown if currently breastfeeding.    Basename 02/12/11 0521 02/10/11 2130  WBC 12.0* 8.8  HGB 11.3* 13.5  HCT 34.3* 39.9  PLT 122* 156    Blood type: O/Positive/-- (04/04 0000) Rubella: Immune (04/04 0000)    Physical Exam:  General: alert, cooperative and no distress Abdomen: soft, nontender, normal bowel sounds Uterine Fundus: firm, below umbilicus, nontender Perineum: well approximated and mild edema Lochia: moderate Rectum: cluster of small hemorrhoids; no evidence of thrombose Ext: Homans sign is negative, no sign of DVT and no edema, redness or tenderness in the calves or thighs   A/P: PPD # 2/ [id number] Doing well and stable for discharge home RX: Ibuprofen 600mg  po Q 6 hrs prn pain #30 Refill x 1 WOB/GYN booklet given Routine pp visit in 6wks

## 2011-02-14 ENCOUNTER — Inpatient Hospital Stay (HOSPITAL_COMMUNITY): Admission: RE | Admit: 2011-02-14 | Payer: 59 | Source: Ambulatory Visit

## 2013-07-14 IMAGING — US US RENAL
1 series · 14 of 25 positions shown · non-contrast
Comparison: None.

CLINICAL DATA: Left lower quadrant and left-sided flank pain

RENAL/URINARY TRACT ULTRASOUND COMPLETE

[Series 1: us renal · 52 acquisitions, 14 frames shown]
[im 1/52]
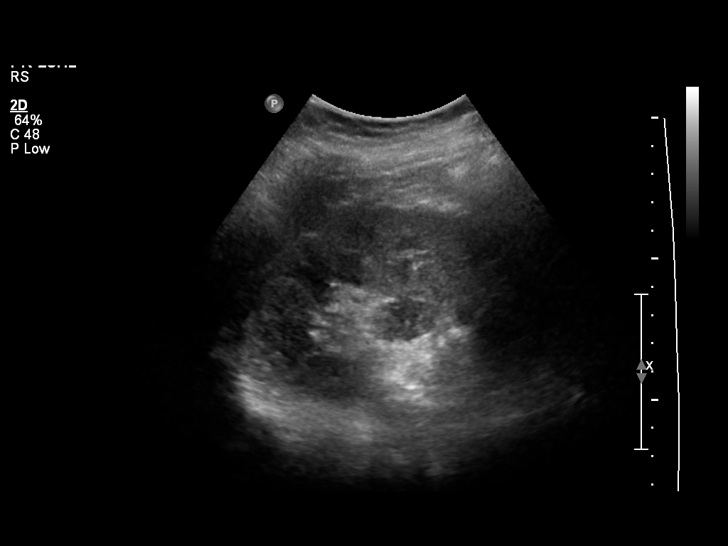
[im 5/52]
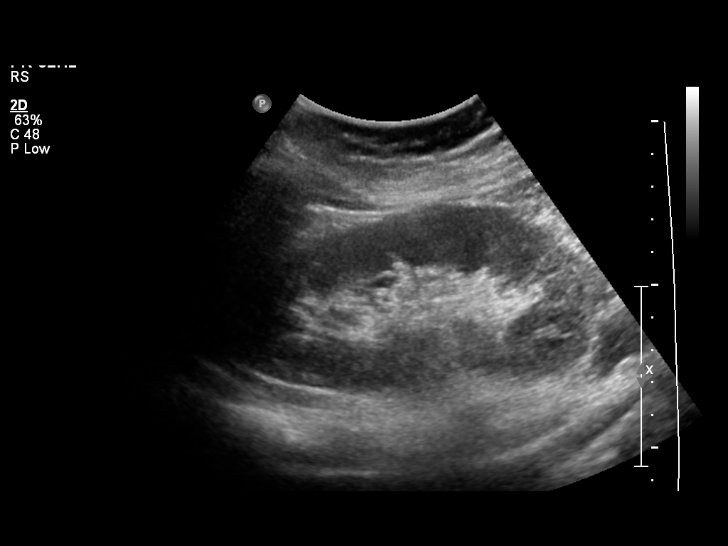
[im 9/52]
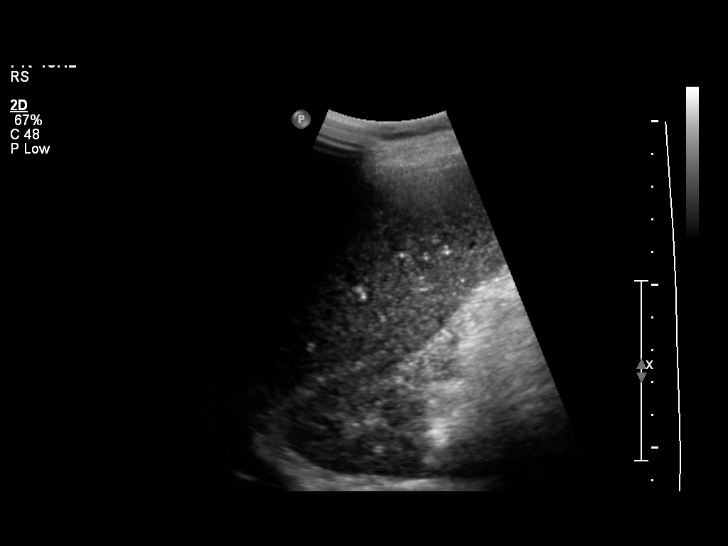
[im 13/52]
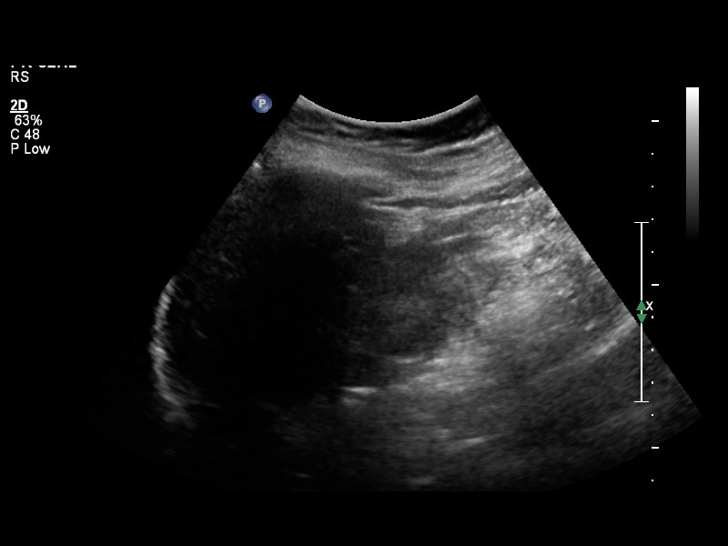
[im 18/52]
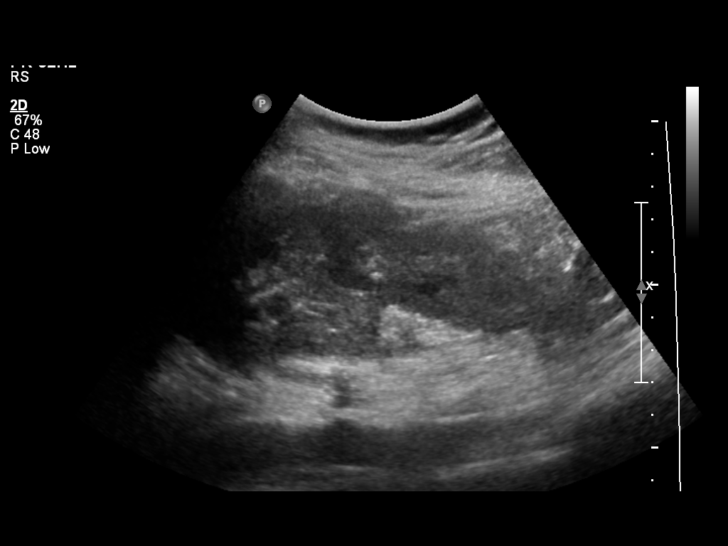
[im 20/52]
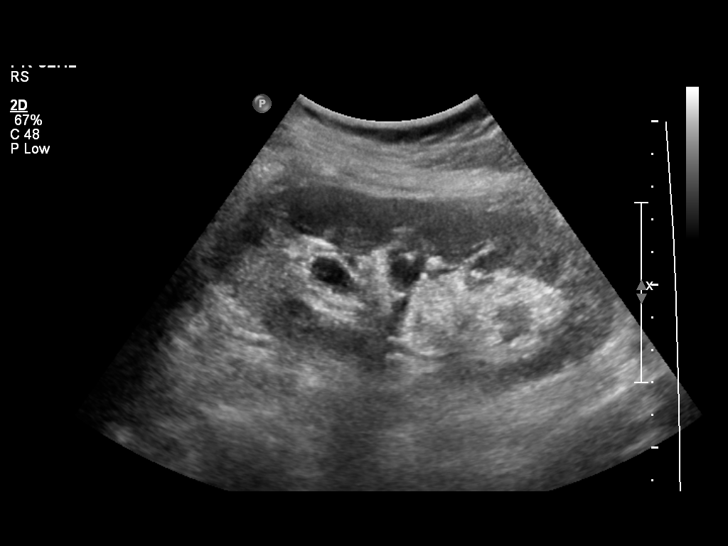
[im 24/52]
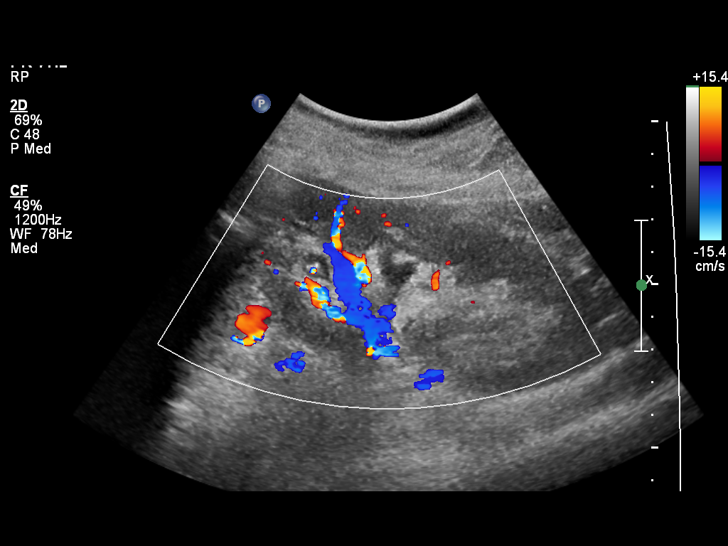
[im 28/52]
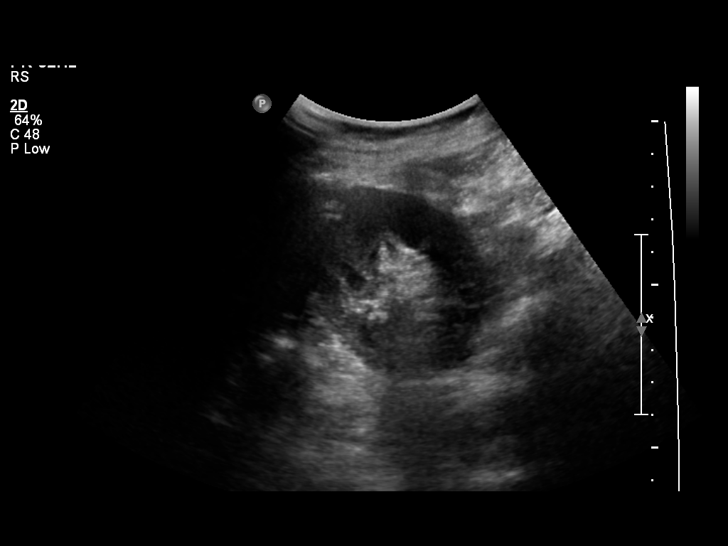
[im 32/52]
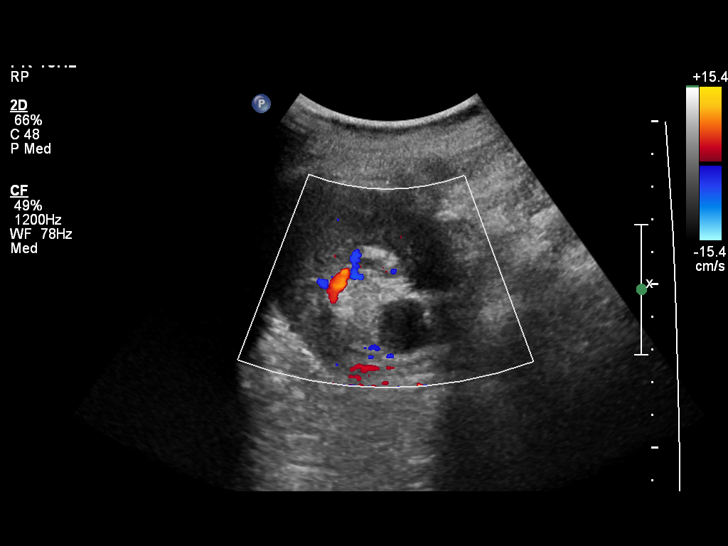
[im 35/52]
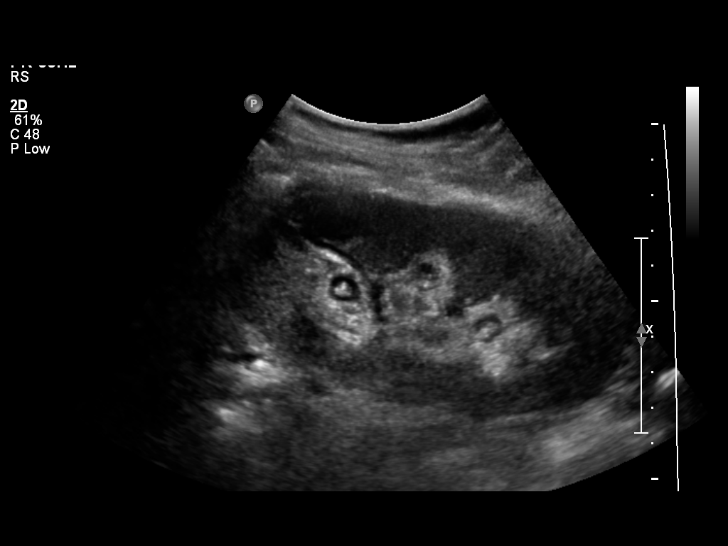
[im 39/52]
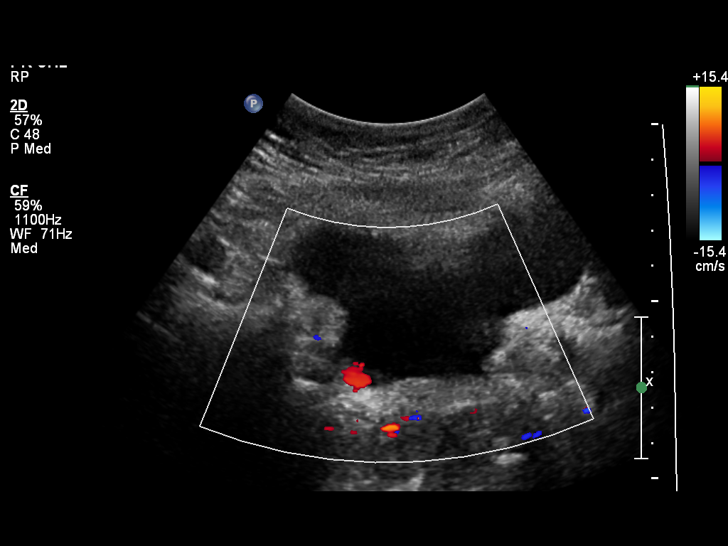
[im 43/52]
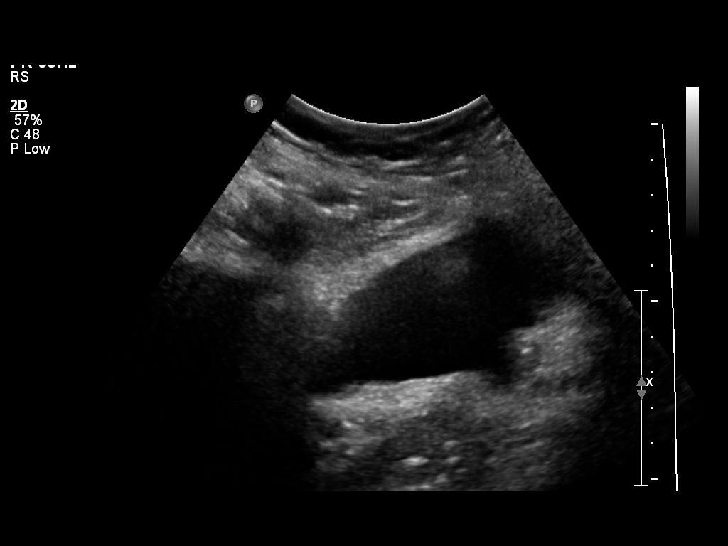
[im 47/52]
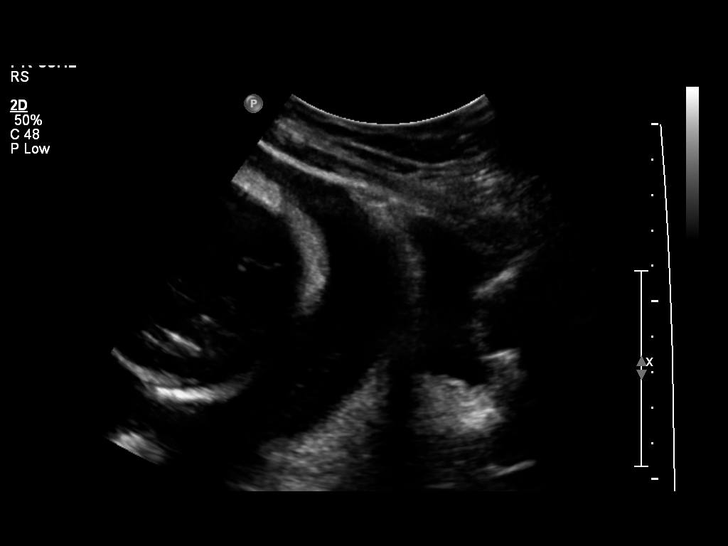
[im 52/52]
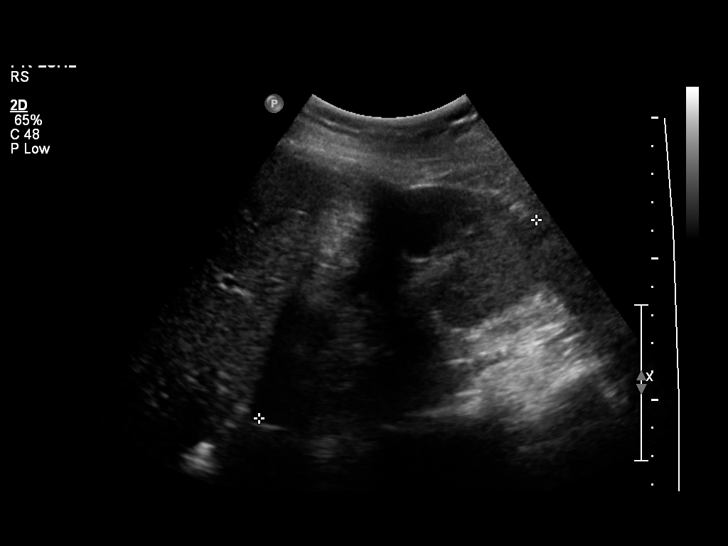

[14 of 25 positions shown; findings below may reference images not displayed]

FINDINGS: Right Kidney:  12.0 cm. No hydronephrosis or focal mass.

Left Kidney:  12.5 cm.  No echogenic shadowing calculus is
identified.  Mild left hydronephrosis is noted.

Bladder:  Normal.  Bilateral ureteral jets visualized.
IMPRESSION: Mildly asymmetric left hydronephrosis without identifiable
calculus.  This could reflect a non visualized calculus or other
ureteral lesion, as generally the left renal collecting system is
less prominent than the right due to anatomic structures ( although
this may be seen as a normal variation).  If symptoms continue,
consider short-term interval repeat ultrasound and possible
transvaginal pelvic ultrasound to better visualize the
ureterovesicular junction, or noncontrast CT or MR urography.

## 2014-02-08 ENCOUNTER — Encounter (HOSPITAL_COMMUNITY): Payer: Self-pay | Admitting: *Deleted

## 2015-11-22 DIAGNOSIS — S61211A Laceration without foreign body of left index finger without damage to nail, initial encounter: Secondary | ICD-10-CM | POA: Diagnosis not present

## 2015-11-22 DIAGNOSIS — S61213A Laceration without foreign body of left middle finger without damage to nail, initial encounter: Secondary | ICD-10-CM | POA: Diagnosis not present

## 2015-12-05 DIAGNOSIS — Z4802 Encounter for removal of sutures: Secondary | ICD-10-CM | POA: Diagnosis not present

## 2016-03-12 DIAGNOSIS — Z23 Encounter for immunization: Secondary | ICD-10-CM | POA: Diagnosis not present

## 2016-04-19 DIAGNOSIS — J069 Acute upper respiratory infection, unspecified: Secondary | ICD-10-CM | POA: Diagnosis not present

## 2016-08-21 DIAGNOSIS — Z1231 Encounter for screening mammogram for malignant neoplasm of breast: Secondary | ICD-10-CM | POA: Diagnosis not present

## 2016-08-21 DIAGNOSIS — Z01419 Encounter for gynecological examination (general) (routine) without abnormal findings: Secondary | ICD-10-CM | POA: Diagnosis not present

## 2016-08-21 DIAGNOSIS — Z6826 Body mass index (BMI) 26.0-26.9, adult: Secondary | ICD-10-CM | POA: Diagnosis not present

## 2017-01-25 DIAGNOSIS — S93402A Sprain of unspecified ligament of left ankle, initial encounter: Secondary | ICD-10-CM | POA: Diagnosis not present

## 2017-01-25 DIAGNOSIS — W108XXA Fall (on) (from) other stairs and steps, initial encounter: Secondary | ICD-10-CM | POA: Diagnosis not present

## 2018-01-30 DIAGNOSIS — J189 Pneumonia, unspecified organism: Secondary | ICD-10-CM | POA: Diagnosis not present

## 2018-03-18 DIAGNOSIS — R05 Cough: Secondary | ICD-10-CM | POA: Diagnosis not present

## 2018-03-18 DIAGNOSIS — J45909 Unspecified asthma, uncomplicated: Secondary | ICD-10-CM | POA: Diagnosis not present

## 2018-04-14 DIAGNOSIS — Z113 Encounter for screening for infections with a predominantly sexual mode of transmission: Secondary | ICD-10-CM | POA: Diagnosis not present

## 2018-04-14 DIAGNOSIS — Z01419 Encounter for gynecological examination (general) (routine) without abnormal findings: Secondary | ICD-10-CM | POA: Diagnosis not present

## 2018-04-14 DIAGNOSIS — Z6829 Body mass index (BMI) 29.0-29.9, adult: Secondary | ICD-10-CM | POA: Diagnosis not present

## 2018-04-14 DIAGNOSIS — Z1151 Encounter for screening for human papillomavirus (HPV): Secondary | ICD-10-CM | POA: Diagnosis not present

## 2018-04-14 DIAGNOSIS — Z30433 Encounter for removal and reinsertion of intrauterine contraceptive device: Secondary | ICD-10-CM | POA: Diagnosis not present

## 2018-05-12 DIAGNOSIS — Z1231 Encounter for screening mammogram for malignant neoplasm of breast: Secondary | ICD-10-CM | POA: Diagnosis not present

## 2018-05-12 DIAGNOSIS — N939 Abnormal uterine and vaginal bleeding, unspecified: Secondary | ICD-10-CM | POA: Diagnosis not present

## 2018-11-26 DIAGNOSIS — Z6830 Body mass index (BMI) 30.0-30.9, adult: Secondary | ICD-10-CM | POA: Diagnosis not present

## 2018-11-26 DIAGNOSIS — Z0000 Encounter for general adult medical examination without abnormal findings: Secondary | ICD-10-CM | POA: Diagnosis not present

## 2018-11-26 DIAGNOSIS — Z13220 Encounter for screening for lipoid disorders: Secondary | ICD-10-CM | POA: Diagnosis not present

## 2018-11-26 DIAGNOSIS — R635 Abnormal weight gain: Secondary | ICD-10-CM | POA: Diagnosis not present

## 2019-04-20 ENCOUNTER — Other Ambulatory Visit: Payer: Self-pay

## 2019-04-20 DIAGNOSIS — G5603 Carpal tunnel syndrome, bilateral upper limbs: Secondary | ICD-10-CM

## 2019-04-21 ENCOUNTER — Encounter: Payer: Self-pay | Admitting: Neurology

## 2019-05-05 ENCOUNTER — Ambulatory Visit (INDEPENDENT_AMBULATORY_CARE_PROVIDER_SITE_OTHER): Payer: 59 | Admitting: Neurology

## 2019-05-05 ENCOUNTER — Other Ambulatory Visit: Payer: Self-pay

## 2019-05-05 DIAGNOSIS — G5601 Carpal tunnel syndrome, right upper limb: Secondary | ICD-10-CM

## 2019-05-05 DIAGNOSIS — G5603 Carpal tunnel syndrome, bilateral upper limbs: Secondary | ICD-10-CM | POA: Diagnosis not present

## 2019-05-05 NOTE — Procedures (Signed)
[facility]  [address] [place], [address]  [place], [place] [address] Tel: [phone] Fax:  [phone] Test Date:  05/05/2019  Patient: Paige Ramirez DOB: [birth year] Physician: Nita Sickle, DO  Sex: Female Height: 5\' 4"  Ref Phys: , MD  ID#: Pati Gallo Temp: 35.0C Technician:    Patient Complaints: This is a 44 year old female referred for evaluation of bilateral hand paresthesias and wrist pain.  NCV & EMG Findings: Extensive electrodiagnostic testing of the right upper extremity and additional studies of the left shows:  1. Right median/ulnar (palm) comparison nerve showed prolonged distal peak latency (Median Palm, 2.5 ms).  Left mixed palmar and bilateral median and ulnar sensory responses are within normal limits.   2. Bilateral median and ulnar motor responses are within normal limits.  Of note, there is evidence of a Martin-Gruber anastomosis on the left upper extremity.   3. There is no evidence of active or chronic motor axonal loss changes affecting any of the tested muscles.  Motor unit configuration and recruitment pattern is within normal limits.    Impression: 1. 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   2. Incidentally, there is a left Martin-Gruber anastomoses, a normal anatomic variant. 3. There is no evidence of left carpal tunnel syndrome or cervical radiculopathy affecting either upper extremity.   ___________________________ 59, DO    Nerve Conduction Studies Anti Sensory Summary Table   Site NR Peak (ms) Norm Peak (ms) P-T Amp (V) Norm P-T Amp  Left Median Anti Sensory (2nd Digit)  35C  Wrist    2.8 <3.7 66.8 >20  Right Median Anti Sensory (2nd Digit)  35C  Wrist    3.6 <3.7 48.7 >20  Left Ulnar Anti Sensory (5th Digit)  35C  Wrist    2.6 <3.5 42.9 >10  Right Ulnar Anti Sensory (5th Digit)  35C  Wrist    2.8 <3.5 40.2 >10    Motor Summary Table   Site NR Onset (ms) Norm Onset (ms) O-P Amp (mV) Norm O-P Amp Site1 Site2 Delta-0 (ms) Dist (cm) Vel (m/s) Norm Vel (m/s)  Left Median Motor (Abd Poll Brev)  35C  Wrist    3.0 <4.4 8.3 >4 Elbow Wrist 4.8 27.0 56 >49  Elbow    7.8  8.8  ulnar-wrist crossover Elbow 2.9 0.0    ulnar-wrist crossover    4.9  3.6         Right Median Motor (Abd Poll Brev)  35C  Wrist    3.7 <4.4 14.8 >4 Elbow Wrist 4.3 28.0 65 >49  Elbow    8.0  14.4         Left Ulnar Motor (Abd Dig Minimi)  35C  Wrist    2.7 <3.5 8.0 >6 B Elbow Wrist 3.4 23.0 68 >49  B Elbow    6.1  7.8  A Elbow B Elbow 1.6 10.0 62 >49  A Elbow    7.7  7.7         Right Ulnar Motor (Abd Dig Minimi)  35C  Wrist    2.7 <3.5 10.9 >6 B Elbow Wrist 3.6 23.0 64 >49  B Elbow    6.3  10.7  A Elbow B Elbow 1.5 10.0 67 >49  A Elbow    7.8  10.6          Comparison Summary Table   Site NR Peak (ms) Norm Peak (ms)  P-T Amp (V) Site1 Site2 Delta-P (ms) Norm Delta (ms)  Left Median/Ulnar Palm Comparison (Wrist - 8cm)  35C  Median Palm    1.7 <2.2 75.7 Median Palm Ulnar Palm 0.2   Ulnar Palm    1.5 <2.2 21.4      Right Median/Ulnar Palm Comparison (Wrist - 8cm)  35C  Median Palm    2.5 <2.2 46.0 Median Palm Ulnar Palm 0.8   Ulnar Palm    1.7 <2.2 31.1       EMG   Side Muscle Ins Act Fibs Psw Fasc Number Recrt Dur Dur. Amp Amp. Poly Poly. Comment  Right 1stDorInt Nml Nml Nml Nml Nml Nml Nml Nml Nml Nml Nml Nml N/A  Right Abd Poll Brev Nml Nml Nml Nml Nml Nml Nml Nml Nml Nml Nml Nml N/A  Right PronatorTeres Nml Nml Nml Nml Nml Nml Nml Nml Nml Nml Nml Nml N/A  Right Biceps Nml Nml Nml Nml Nml Nml Nml Nml Nml Nml Nml Nml N/A  Right Triceps Nml Nml Nml Nml Nml Nml Nml Nml Nml Nml Nml Nml N/A  Right Deltoid Nml Nml Nml Nml Nml Nml Nml Nml Nml Nml Nml Nml N/A  Left 1stDorInt Nml Nml Nml Nml Nml Nml Nml Nml Nml Nml Nml Nml N/A  Left Abd Poll Brev Nml Nml Nml Nml Nml Nml Nml Nml Nml Nml Nml Nml N/A  Left PronatorTeres Nml  Nml Nml Nml Nml Nml Nml Nml Nml Nml Nml Nml N/A  Left Biceps Nml Nml Nml Nml Nml Nml Nml Nml Nml Nml Nml Nml N/A  Left Triceps Nml Nml Nml Nml Nml Nml Nml Nml Nml Nml Nml Nml N/A  Left Deltoid Nml Nml Nml Nml Nml Nml Nml Nml Nml Nml Nml Nml N/A      Waveforms:

## 2019-05-12 ENCOUNTER — Encounter: Payer: 59 | Admitting: Neurology

## 2023-04-04 LAB — EXTERNAL GENERIC LAB PROCEDURE

## 2023-06-26 LAB — EXTERNAL GENERIC LAB PROCEDURE: COLOGUARD: NEGATIVE

## 2023-07-09 NOTE — Progress Notes (Unsigned)
 Cardiology Office Note:  .   Date:  07/12/2023  ID:  Paige Ramirez, DOB 01-20-1976, MRN [id number] PCP: Claiborne Billings, MD  [facility] Providers Cardiologist:  None { History of Present Illness: .    Chief Complaint  Patient presents with   Palpitations         Paige Ramirez is a 48 y.o. female with history of tachycardia who presents for the evaluation of palpitations.   History of Present Illness   Paige Ramirez is a 48 year old female with tachycardia who presents with palpitations and tachycardia.  She has experienced tachycardia since school age, with a recent increase in episode frequency. Notable episodes include one in January after skiing, drinking mimosas, and coffee, lasting an hour with heart rates between 160 to 190 bpm, and another last weekend lasting ten minutes without clear triggers. Episodes often occur at night and during the day at work.  Historically, maneuvers such as bearing down or drinking water have helped terminate episodes, but these were ineffective during the longer episodes. She uses an Apple Watch to monitor her heart rate, which shows rates between 160 to 190 bpm during episodes. She recently acquired a KardiaMobile device but was unable to use it during the last episode.  She consumes three to four cups of coffee daily and approximately ten glasses of wine per week, noting that she may consume too much alcohol at once. She denies smoking or drug use. 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  Her father has a history of heart disease, including atrial fibrillation.  No chest pain, shortness of breath, or sleep apnea. She reports low energy recently, which she attributes to her age. She has not been exercising recently but usually does. She drinks a lot of water daily.          Problem List Tachycardia  PCOS    ROS: All other ROS reviewed and negative. Pertinent positives noted in the HPI.     Studies  Reviewed: Marland Kitchen   EKG Interpretation Date/Time:  Friday July 12 2023 08:32:27 EDT Ventricular Rate:  89 PR Interval:  126 QRS Duration:  78 QT Interval:  352 QTC Calculation: 428 R Axis:   19  Text Interpretation: Normal sinus rhythm Low voltage QRS Confirmed by Lennie Odor [phone]) on 07/12/2023 8:35:25 AM   Physical Exam:   VS:  BP 120/82 (BP Location: Right Arm, Patient Position: Sitting, Cuff Size: Large)   Pulse 89   Ht 5' 5.73" (1.67 m)   Wt 182 lb (82.6 kg)   SpO2 99%   BMI 29.62 kg/m    Wt Readings from Last 3 Encounters:  07/12/23 182 lb (82.6 kg)  02/10/11 173 lb (78.5 kg)    GEN: Well nourished, well developed in no acute distress NECK: No JVD; No carotid bruits CARDIAC: RRR, no murmurs, rubs, gallops RESPIRATORY:  Clear to auscultation without rales, wheezing or rhonchi  ABDOMEN: Soft, non-tender, non-distended EXTREMITIES:  No edema; No deformity  ASSESSMENT AND PLAN: .   Assessment and Plan    Palpitations and Tachycardia Tachycardia with increased frequency, possibly exacerbated by lifestyle factors and hormonal changes. Family history of atrial fibrillation noted. Discussed potential hormonal influence if entering menopause. Explained Zio patch and KardiaMobile for monitoring. - Order 14-day Zio patch for arrhythmia monitoring. - Advise use of KardiaMobile for episode capture. - Recommend lifestyle modifications: adequate hydration, moderation of caffeine and alcohol intake, regular exercise, and good sleep hygiene.  Follow-up: Return if symptoms worsen or fail to improve.  Signed, Lenna Gilford. Flora Lipps, MD, [facility]  [facility]  [address], [address] [place], [place] [address] [phone]  8:49 AM

## 2023-07-12 ENCOUNTER — Ambulatory Visit: Payer: 59 | Attending: Cardiovascular Disease | Admitting: Cardiovascular Disease

## 2023-07-12 ENCOUNTER — Encounter: Payer: Self-pay | Admitting: Cardiovascular Disease

## 2023-07-12 VITALS — BP 120/82 | HR 89 | Ht 65.73 in | Wt 182.0 lb

## 2023-07-12 DIAGNOSIS — R002 Palpitations: Secondary | ICD-10-CM | POA: Diagnosis not present

## 2023-07-12 DIAGNOSIS — R000 Tachycardia, unspecified: Secondary | ICD-10-CM | POA: Diagnosis not present

## 2023-07-12 NOTE — Patient Instructions (Signed)
 Medication Instructions:  Your physician recommends that you continue on your current medications as directed. Please refer to the Current Medication list given to you today.    *If you need a refill on your cardiac medications before your next appointment, please call your pharmacy*   Lab Work: None    If you have labs (blood work) drawn today and your tests are completely normal, you will receive your results only by: MyChart Message (if you have MyChart) OR A paper copy in the mail If you have any lab test that is abnormal or we need to change your treatment, we will call you to review the results.   Testing/Procedures: Christena Deem- Long Term Monitor Instructions  Your physician has requested you wear a ZIO patch monitor for 14 days.  This is a single patch monitor. [employer] supplies one patch monitor per enrollment. Additional stickers are not available. Please do not apply patch if you will be having a Nuclear Stress Test,  Echocardiogram, Cardiac CT, MRI, or Chest Xray during the period you would be wearing the  monitor. The patch cannot be worn during these tests. You cannot remove and re-apply the  ZIO XT patch monitor.  Your ZIO patch monitor will be mailed 3 day USPS to your address on file. It may take 3-5 days  to receive your monitor after you have been enrolled.  Once you have received your monitor, please review the enclosed instructions. Your monitor  has already been registered assigning a specific monitor serial # to you.  Billing and Patient Assistance Program Information  We have supplied [employer] with any of your insurance information on file for billing purposes. [employer] offers a sliding scale Patient Assistance Program for patients that do not have  insurance, or whose insurance does not completely cover the cost of the ZIO monitor.  You must apply for the Patient Assistance Program to qualify for this discounted rate.  To apply, please call [employer] at  [phone], select option 4, select option 2, ask to apply for  Patient Assistance Program. Meredeth Ide will ask your household income, and how many people  are in your household. They will quote your out-of-pocket cost based on that information.  [employer] will also be able to set up a 73-month, interest-free payment plan if needed.  Applying the monitor   Shave hair from upper left chest.  Hold abrader disc by orange tab. Rub abrader in 40 strokes over the upper left chest as  indicated in your monitor instructions.  Clean area with 4 enclosed alcohol pads. Let dry.  Apply patch as indicated in monitor instructions. Patch will be placed under collarbone on left  side of chest with arrow pointing upward.  Rub patch adhesive wings for 2 minutes. Remove white label marked "1". Remove the white  label marked "2". Rub patch adhesive wings for 2 additional minutes.  While looking in a mirror, press and release button in center of patch. A small green light will  flash 3-4 times. This will be your only indicator that the monitor has been turned on.  Do not shower for the first 24 hours. You may shower after the first 24 hours.  Press the button if you feel a symptom. You will hear a small click. Record Date, Time and  Symptom in the Patient Logbook.  When you are ready to remove the patch, follow instructions on the last 2 pages of Patient  Logbook. Stick patch monitor onto the last page of Patient Logbook.  Place Patient Logbook in the blue and white box. Use locking tab on box and tape box closed  securely. The blue and white box has prepaid postage on it. Please place it in the mailbox as  soon as possible. Your physician should have your test results approximately 7 days after the  monitor has been mailed back to [facility].  Call [facility] Customer Care at [phone] if you have questions regarding  your ZIO XT patch monitor. Call them immediately if you see an orange light  blinking on your  monitor.  If your monitor falls off in less than 4 days, contact our Monitor department at [phone].  If your monitor becomes loose or falls off after 4 days call [employer] at [phone] for  suggestions on securing your monitor    Follow-Up: At [facility], you and your health needs are our priority.  As part of our continuing mission to provide you with exceptional heart care, we have created designated Provider Care Teams.  These Care Teams include your primary Cardiologist (physician) and Advanced Practice Providers (APPs -  Physician Assistants and Nurse Practitioners) who all work together to provide you with the care you need, when you need it.  We recommend signing up for the patient portal called "MyChart".  Sign up information is provided on this After Visit Summary.  MyChart is used to connect with patients for Virtual Visits (Telemedicine).  Patients are able to view lab/test results, encounter notes, upcoming appointments, etc.  Non-urgent messages can be sent to your provider as well.   To learn more about what you can do with MyChart, go to [email or web address].    Your next appointment:   Follow up as needed following results of cardiac testing   Provider:   Lennie Odor, MD     Other Instructions

## 2023-07-15 ENCOUNTER — Ambulatory Visit: Attending: Cardiovascular Disease

## 2023-07-15 DIAGNOSIS — R002 Palpitations: Secondary | ICD-10-CM

## 2023-07-15 NOTE — Progress Notes (Unsigned)
 Enrolled for [employer] to mail a ZIO XT long term holter monitor to the patients address on file.

## 2023-10-13 ENCOUNTER — Ambulatory Visit: Payer: Self-pay | Admitting: Cardiovascular Disease

## 2023-10-13 DIAGNOSIS — R002 Palpitations: Secondary | ICD-10-CM

## 2023-10-16 LAB — COLOGUARD

## 2023-10-25 ENCOUNTER — Encounter: Payer: Self-pay | Admitting: Advanced Practice Midwife
